# Patient Record
Sex: Female | Born: 1945 | Race: White | Hispanic: No | Smoking: Never smoker
Health system: Southern US, Community
[De-identification: ages and names within clinical notes are randomized; demographics above are authoritative.]

## PROBLEM LIST (undated history)

## (undated) DIAGNOSIS — J45909 Unspecified asthma, uncomplicated: Secondary | ICD-10-CM

## (undated) DIAGNOSIS — I10 Essential (primary) hypertension: Secondary | ICD-10-CM

---

## 2015-10-20 ENCOUNTER — Encounter (HOSPITAL_COMMUNITY): Payer: Self-pay | Admitting: *Deleted

## 2015-10-20 ENCOUNTER — Emergency Department (HOSPITAL_COMMUNITY)
Admission: EM | Admit: 2015-10-20 | Discharge: 2015-10-21 | Disposition: A | Discharge status: Home / Self Care | Payer: Medicare Other | Attending: Emergency Medicine | Admitting: Emergency Medicine

## 2015-10-20 ENCOUNTER — Emergency Department (HOSPITAL_COMMUNITY): Payer: Medicare Other

## 2015-10-20 DIAGNOSIS — S4992XA Unspecified injury of left shoulder and upper arm, initial encounter: Secondary | ICD-10-CM | POA: Diagnosis present

## 2015-10-20 DIAGNOSIS — Z79899 Other long term (current) drug therapy: Secondary | ICD-10-CM | POA: Diagnosis not present

## 2015-10-20 DIAGNOSIS — J45909 Unspecified asthma, uncomplicated: Secondary | ICD-10-CM | POA: Insufficient documentation

## 2015-10-20 DIAGNOSIS — Y998 Other external cause status: Secondary | ICD-10-CM | POA: Insufficient documentation

## 2015-10-20 DIAGNOSIS — W010XXA Fall on same level from slipping, tripping and stumbling without subsequent striking against object, initial encounter: Secondary | ICD-10-CM | POA: Diagnosis not present

## 2015-10-20 DIAGNOSIS — Y9289 Other specified places as the place of occurrence of the external cause: Secondary | ICD-10-CM | POA: Diagnosis not present

## 2015-10-20 DIAGNOSIS — S42292A Other displaced fracture of upper end of left humerus, initial encounter for closed fracture: Secondary | ICD-10-CM | POA: Insufficient documentation

## 2015-10-20 DIAGNOSIS — Y9389 Activity, other specified: Secondary | ICD-10-CM | POA: Insufficient documentation

## 2015-10-20 DIAGNOSIS — I10 Essential (primary) hypertension: Secondary | ICD-10-CM | POA: Diagnosis not present

## 2015-10-20 DIAGNOSIS — S42302A Unspecified fracture of shaft of humerus, left arm, initial encounter for closed fracture: Secondary | ICD-10-CM

## 2015-10-20 HISTORY — DX: Unspecified asthma, uncomplicated: J45.909

## 2015-10-20 HISTORY — DX: Essential (primary) hypertension: I10

## 2015-10-20 MED ORDER — HYDROCODONE-ACETAMINOPHEN 5-325 MG PO TABS: 1.0000 | ORAL_TABLET | ORAL | Status: AC | PRN

## 2015-10-20 MED ORDER — HYDROCODONE-ACETAMINOPHEN 5-325 MG PO TABS
1.0000 | ORAL_TABLET | Freq: Once | ORAL | Status: AC
  Administered 2015-10-20: 1 via ORAL
  Filled 2015-10-20: qty 1

## 2015-10-20 NOTE — Progress Notes (Signed)
Orthopedic Tech Progress Note Patient Details:  Danielle Mora 1945/11/09 [id number]  Ortho Devices Type of Ortho Device: Arm sling Ortho Device/Splint Location: lue Ortho Device/Splint Interventions: Ordered, Application   Trinna Post 10/20/2015, 11:47 PM

## 2015-10-20 NOTE — ED Notes (Signed)
Pt states she tripped and fell on her left arm. Good sensation in left extremity, strong pulse. Pt states pain starts in elbow and radiates up to shoulder. Worse with movement. Denies hitting her head, denies LOC.

## 2015-10-20 NOTE — ED Provider Notes (Signed)
CSN: [id number]     Arrival date & time 10/20/15  4098 History   First MD Initiated Contact with Patient 10/20/15 2152     Chief Complaint  Patient presents with  . Fall  . Arm Injury   Patient is a 70 y.o. female presenting with arm injury. The history is provided by the patient. No language interpreter was used.  Arm Injury Location:  Shoulder Time since incident:  5 hours Injury: yes   Mechanism of injury: amputation   Pain details:    Quality:  Aching   Radiates to:  Does not radiate   Severity:  Moderate   Onset quality:  Sudden   Duration:  5 hours   Timing:  Constant   Progression:  Unchanged Chronicity:  New Relieved by:  None tried Worsened by:  Nothing tried Ineffective treatments:  None tried Associated symptoms: decreased range of motion and swelling   Associated symptoms: no back pain, no fever, no neck pain, no numbness and no tingling     Past Medical History  Diagnosis Date  . Hypertension   . Asthma    History reviewed. No pertinent past surgical history. No family history on file. Social History  Substance Use Topics  . Smoking status: Never Smoker   . Smokeless tobacco: None  . Alcohol Use: No   OB History    No data available     Review of Systems  Constitutional: Negative for fever, chills, activity change and appetite change.  HENT: Negative for congestion, dental problem, ear pain, facial swelling, hearing loss, rhinorrhea, sneezing, sore throat, trouble swallowing and voice change.   Eyes: Negative for photophobia, pain, redness and visual disturbance.  Respiratory: Negative for apnea, cough, chest tightness, shortness of breath, wheezing and stridor.   Cardiovascular: Negative for chest pain, palpitations and leg swelling.  Gastrointestinal: Negative for nausea, vomiting, abdominal pain, diarrhea, constipation, blood in stool and abdominal distention.  Endocrine: Negative for polydipsia and polyuria.  Genitourinary: Negative for  frequency, hematuria, flank pain, decreased urine volume and difficulty urinating.  Musculoskeletal: Positive for joint swelling. Negative for back pain, gait problem, neck pain and neck stiffness.  Skin: Negative for rash and wound.  Allergic/Immunologic: Negative for immunocompromised state.  Neurological: Negative for dizziness, syncope, facial asymmetry, speech difficulty, weakness, light-headedness, numbness and headaches.  Hematological: Negative for adenopathy.  Psychiatric/Behavioral: Negative for suicidal ideas, behavioral problems, confusion, sleep disturbance and agitation. The patient is not nervous/anxious.   All other systems reviewed and are negative.   Allergies  Review of patient's allergies indicates no known allergies.  Home Medications   Prior to Admission medications   Medication Sig Start Date End Date Taking? Authorizing Provider  lisinopril (PRINIVIL,ZESTRIL) 40 MG tablet Take 40 mg by mouth daily. 10/07/15  Yes Historical Provider, MD  HYDROcodone-acetaminophen (NORCO/VICODIN) 5-325 MG tablet Take 1 tablet by mouth every 4 (four) hours as needed. 10/20/15   Dan Humphreys, MD   BP 110/71 mmHg  Pulse 78  Temp(Src) 98 F (36.7 C) (Oral)  Resp 18  SpO2 96% Physical Exam  Constitutional: She is oriented to person, place, and time. She appears well-developed and well-nourished. No distress.  HENT:  Head: Normocephalic and atraumatic.  Right Ear: External ear normal.  Left Ear: External ear normal.  Eyes: Pupils are equal, round, and reactive to light. Right eye exhibits no discharge. Left eye exhibits no discharge.  Neck: Normal range of motion. No JVD present. No tracheal deviation present.  Cardiovascular: Normal rate, regular rhythm  and normal heart sounds.  Exam reveals no friction rub.   No murmur heard. Pulmonary/Chest: Effort normal and breath sounds normal. No stridor. No respiratory distress. She has no wheezes.  Abdominal: Soft. Bowel sounds are normal.  She exhibits no distension. There is no rebound and no guarding.  Musculoskeletal: She exhibits no edema.       Left shoulder: She exhibits decreased range of motion, tenderness and swelling. She exhibits normal pulse.  Lymphadenopathy:    She has no cervical adenopathy.  Neurological: She is alert and oriented to person, place, and time. No cranial nerve deficit. Coordination normal.  Skin: Skin is warm and dry. No rash noted. No pallor.  Psychiatric: She has a normal mood and affect. Her behavior is normal. Judgment and thought content normal.  Nursing note and vitals reviewed.   ED Course  Procedures (including critical care time) Labs Review Labs Reviewed - No data to display  Imaging Review Dg Elbow Complete Left  10/20/2015  CLINICAL DATA:  Tripped over a toy and fell onto wood floor. Proximal humerus pain. EXAM: LEFT ELBOW - COMPLETE 3+ VIEW COMPARISON:  None. FINDINGS: Three-view exam of the left elbow shows no gross fracture or dislocation, but assessment is limited by non traditional positioning and lack of a true lateral view to assess for joint effusion. Bones appear demineralized. IMPRESSION: No gross bony abnormality although study limited by positioning. Consider repeat evaluation after the patient is better able to participate in positioning. Electronically Signed   By: Kennith Center M.D.   On: 10/20/2015 20:44   Dg Shoulder Left  10/20/2015  CLINICAL DATA:  Tripped over a toy and fell onto a wppd floor. EXAM: LEFT SHOULDER - 2+ VIEW COMPARISON:  None. FINDINGS: Comminuted fracture the humeral head noted. Greater tuberosity exists is a free fragment. No evidence for dislocation. No shoulder separation. IMPRESSION: Comminuted fracture of the humeral head. Electronically Signed   By: Kennith Center M.D.   On: 10/20/2015 20:46   I have personally reviewed and evaluated these images and lab results as part of my medical decision-making.   EKG Interpretation None      MDM    Final diagnoses:  Humerus fracture, left, closed, initial encounter    Patient tripped over a toy and fell on her left shoulder tonight. She has tenderness over the left shoulder. No tenderness over the clavicle. No tenderness over the elbow, forearm, wrist. She has good pulses and sensation in the distal left extremity. No LOC, no head or neck trauma. GCS 15  Vital signs stable. X-ray with comminuted fracture of humeral head.  Sling applied and patient instructed to follow-up with orthopedics in one week for reevaluation. Norco in ED and at home for pain control.  Ambulatory in no acute distress at time of discharge.  Discussed case with my attending Dr. Fayrene Fearing.    Dan Humphreys, MD 10/20/15 5784  Rolland Porter, MD 10/21/15 [phone]

## 2015-10-20 NOTE — Discharge Instructions (Signed)
Humerus Fracture Treated With Immobilization °The humerus is the large bone in your upper arm. You have a broken (fractured) humerus. These fractures are easily diagnosed with X-rays. °TREATMENT  °Simple fractures which will heal without disability are treated with simple immobilization. Immobilization means you will wear a cast, splint, or sling. You have a fracture which will do well with immobilization. The fracture will heal well simply by being held in a good position until it is stable enough to begin range of motion exercises. Do not take part in activities which would further injure your arm.  °HOME CARE INSTRUCTIONS  °· Put ice on the injured area. °¨ Put ice in a plastic bag. °¨ Place a towel between your skin and the bag. °¨ Leave the ice on for 15-20 minutes, 03-04 times a day. °· If you have a cast: °¨ Do not scratch the skin under the cast using sharp or pointed objects. °¨ Check the skin around the cast every day. You may put lotion on any red or sore areas. °¨ Keep your cast dry and clean. °· If you have a splint: °¨ Wear the splint as directed. °¨ Keep your splint dry and clean. °¨ You may loosen the elastic around the splint if your fingers become numb, tingle, or turn cold or blue. °· If you have a sling: °¨ Wear the sling as directed. °· Do not put pressure on any part of your cast or splint until it is fully hardened. °· Your cast or splint can be protected during bathing with a plastic bag. Do not lower the cast or splint into water. °· Only take over-the-counter or prescription medicines for pain, discomfort, or fever as directed by your caregiver. °· Do range of motion exercises as instructed by your caregiver. °· Follow up as directed by your caregiver. This is very important in order to avoid permanent injury or disability and chronic pain. °SEEK IMMEDIATE MEDICAL CARE IF:  °· Your skin or nails in the injured arm turn blue or gray. °· Your arm feels cold or numb. °· You develop severe pain  in the injured arm. °· You are having problems with the medicines you were given. °MAKE SURE YOU:  °· Understand these instructions. °· Will watch your condition. °· Will get help right away if you are not doing well or get worse. °  °This information is not intended to replace advice given to you by your health care provider. Make sure you discuss any questions you have with your health care provider. °  °Document Released: 11/20/2000 Document Revised: 09/04/2014 Document Reviewed: 01/06/2015 °Elsevier Interactive Patient Education ©2016 [employer] ° °

## 2015-10-21 NOTE — ED Provider Notes (Signed)
Pt seen and evaluated.  Patient with a fall and left shoulder pain. Has a three-part fracture. Impacted neck, and floating greater tuberosity. Symptoms well controlled after by mouth meds here. Neurologically neurovascularly intact. I discussed with her to expect bruising and swelling. Sleep in a position of comfort. With peak follow-up. Placed in a sling and immobilizer here. Pain medication discharged home for orthopedic follow-up.  Rolland Porter, MD 10/21/15 [phone]

## 2017-05-16 ENCOUNTER — Emergency Department: Payer: Medicare Other

## 2017-05-16 ENCOUNTER — Emergency Department
Admission: EM | Admit: 2017-05-16 | Discharge: 2017-05-16 | Disposition: A | Discharge status: Home / Self Care | Payer: Medicare Other | Attending: Emergency Medicine | Admitting: Emergency Medicine

## 2017-05-16 DIAGNOSIS — R1011 Right upper quadrant pain: Secondary | ICD-10-CM | POA: Insufficient documentation

## 2017-05-16 DIAGNOSIS — I10 Essential (primary) hypertension: Secondary | ICD-10-CM | POA: Diagnosis not present

## 2017-05-16 DIAGNOSIS — R0602 Shortness of breath: Secondary | ICD-10-CM | POA: Diagnosis present

## 2017-05-16 DIAGNOSIS — R1110 Vomiting, unspecified: Secondary | ICD-10-CM | POA: Diagnosis not present

## 2017-05-16 DIAGNOSIS — J45909 Unspecified asthma, uncomplicated: Secondary | ICD-10-CM | POA: Insufficient documentation

## 2017-05-16 DIAGNOSIS — K8020 Calculus of gallbladder without cholecystitis without obstruction: Secondary | ICD-10-CM | POA: Insufficient documentation

## 2017-05-16 DIAGNOSIS — Z79899 Other long term (current) drug therapy: Secondary | ICD-10-CM | POA: Diagnosis not present

## 2017-05-16 LAB — COMPREHENSIVE METABOLIC PANEL
ALBUMIN: 3.7 g/dL (ref 3.5–5.0)
ALT: 34 U/L (ref 14–54)
ANION GAP: 9 (ref 5–15)
AST: 51 U/L — AB (ref 15–41)
Alkaline Phosphatase: 59 U/L (ref 38–126)
BILIRUBIN TOTAL: 0.6 mg/dL (ref 0.3–1.2)
BUN: 19 mg/dL (ref 6–20)
CO2: 27 mmol/L (ref 22–32)
Calcium: 9.2 mg/dL (ref 8.9–10.3)
Chloride: 104 mmol/L (ref 101–111)
Creatinine, Ser: 0.97 mg/dL (ref 0.44–1.00)
GFR calc Af Amer: >60 mL/min (ref >60)
GFR calc non Af Amer: 58 mL/min — ABNORMAL LOW (ref >60)
GLUCOSE: 129 mg/dL — AB (ref 65–99)
Potassium: 3.3 mmol/L — ABNORMAL LOW (ref 3.5–5.1)
Sodium: 140 mmol/L (ref 135–145)
TOTAL PROTEIN: 6.8 g/dL (ref 6.5–8.1)

## 2017-05-16 LAB — CBC
HEMATOCRIT: 34.8 % — AB (ref 35.0–47.0)
HEMOGLOBIN: 12.1 g/dL (ref 12.0–16.0)
MCH: 30 pg (ref 26.0–34.0)
MCHC: 34.9 g/dL (ref 32.0–36.0)
MCV: 86 fL (ref 80.0–100.0)
Platelets: 248 10*3/uL (ref 150–440)
RBC: 4.05 MIL/uL (ref 3.80–5.20)
RDW: 13.5 % (ref 11.5–14.5)
WBC: 9.8 10*3/uL (ref 3.6–11.0)

## 2017-05-16 LAB — TROPONIN I: Troponin I: <0.03 ng/mL (ref <0.03)

## 2017-05-16 NOTE — ED Notes (Signed)
ED Provider at bedside. 

## 2017-05-16 NOTE — ED Notes (Signed)
Pt states that she had some abdominal pain that was so bad it was causing her to have problems taking a deep breath. Pt points to epigastric area. States she vomited and now she feels back to baseline. Denies pain. Denies diarrhea or fevers. Denies any difficulty breathing. Speaking in complete sentences. States "I didn't eat very well today." admits to eating donuts, peanut butter sandwich, and a salmon patty.  Alert and oriented. Speaking in complete sentences.

## 2017-05-16 NOTE — ED Triage Notes (Signed)
Pt in with co shob and tightness to upper abd and states feels like she cannot take a deep breath. Denies any hx of lung disease, no shob noted at this time.

## 2017-05-18 NOTE — ED Provider Notes (Signed)
[facility] Emergency Department Provider Note   First MD Initiated Contact with Patient 05/16/17 0302     (approximate)  I have reviewed the triage vital signs and the nursing notes.   HISTORY  Chief Complaint Shortness of Breath    HPI Danielle Mora is a 71 y.o. female with below list of chronic medical conditions presents to the emergency department with upper abdominal pain following eating accompanied by nausea. Patient denies any fever no diarrhea or constipation. She states symptoms began after she ate peanut butter on bread and believes that her symptoms may be secondary to indigestion. Patient does admit to previous episodes of the same. Patient states that she has no pain at present   Past Medical History:  Diagnosis Date  . Asthma   . Hypertension     There are no active problems to display for this patient.   No past surgical history on file.  Prior to Admission medications   Medication Sig Start Date End Date Taking? Authorizing Provider  HYDROcodone-acetaminophen (NORCO/VICODIN) 5-325 MG tablet Take 1 tablet by mouth every 4 (four) hours as needed. 10/20/15   Dan Humphreys, MD  lisinopril (PRINIVIL,ZESTRIL) 40 MG tablet Take 40 mg by mouth daily. 10/07/15   [provider]    Allergies No known drug allergies No family history on file.  Social History Social History  Substance Use Topics  . Smoking status: Never Smoker  . Smokeless tobacco: Not on file  . Alcohol use No    Review of Systems Constitutional: No fever/chills Eyes: No visual changes. ENT: No sore throat. Cardiovascular: Denies chest pain. Respiratory: Denies shortness of breath. Gastrointestinal: Positive right upper quadrant/epigastric pain.  No nausea, no vomiting.  No diarrhea.  No constipation. Genitourinary: Negative for dysuria. Musculoskeletal: Negative for neck pain.  Negative for back pain. Integumentary: Negative for rash. Neurological:  Negative for headaches, focal weakness or numbness.   ____________________________________________   PHYSICAL EXAM:  VITAL SIGNS: ED Triage Vitals  Enc Vitals Group     BP 05/16/17 0054 130/71     Pulse Rate 05/16/17 0054 (!) 58     Resp 05/16/17 0054 20     Temp 05/16/17 0054 98.1 F (36.7 C)     Temp Source 05/16/17 0054 Oral     SpO2 05/16/17 0054 98 %     Weight 05/16/17 0055 81.6 kg (180 lb)     Height 05/16/17 0055 1.651 m ( )     Head Circumference --      Peak Flow --      Pain Score 05/16/17 0055 8     Pain Loc --      Pain Edu? --      Excl. in GC? --     Constitutional: Alert and oriented. Well appearing and in no acute distress. Eyes: Conjunctivae are normal.  Head: Atraumatic. Mouth/Throat: Mucous membranes are moist.  Oropharynx non-erythematous. Neck: No stridor.   Cardiovascular: Normal rate, regular rhythm. Good peripheral circulation. Grossly normal heart sounds. Respiratory: Normal respiratory effort.  No retractions. Lungs CTAB. Gastrointestinal: Right upper quadrant tenderness with palpation No distention.  Musculoskeletal: No lower extremity tenderness nor edema. No gross deformities of extremities. Neurologic:  Normal speech and language. No gross focal neurologic deficits are appreciated.  Skin:  Skin is warm, dry and intact. No rash noted. Psychiatric: Mood and affect are normal. Speech and behavior are normal.  ____________________________________________   LABS (all labs ordered are listed, but only abnormal results are  displayed)  Labs Reviewed  CBC - Abnormal; Notable for the following:       Result Value   HCT 34.8 (*)    All other components within normal limits  COMPREHENSIVE METABOLIC PANEL - Abnormal; Notable for the following:    Potassium 3.3 (*)    Glucose, Bld 129 (*)    AST 51 (*)    GFR calc non Af Amer 58 (*)    All other components within normal limits  TROPONIN I    ____________________________________________  EKG  ED ECG REPORT I, Patrick N Smrithi Pigford, the attending physician, personally viewed and interpreted this ECG.   Date: 05/18/2017  EKG Time: 12:57 AM  Rate: 54  Rhythm: Sinus bradycardia  Axis: Normal  Intervals: Normal  ST&T Change: None  ____________________________________________  RADIOLOGY I, [place] Kanae Ignatowski, personally viewed and evaluated these images (plain radiographs) as part of my medical decision making, as well as reviewing the written report by the radiologist.  CLINICAL DATA:  Right upper quadrant pain and vomiting since 8 p.m.  EXAM: ULTRASOUND ABDOMEN LIMITED RIGHT UPPER QUADRANT  COMPARISON:  None.  FINDINGS: Gallbladder:  Cholelithiasis with several stones in the gallbladder, largest measuring 2.5 cm diameter. Borderline gallbladder wall thickening with mild pericholecystic edema is present. Murphy's sign is negative.  Common bile duct:  Diameter: 8.5 mm, mildly dilated. No intraductal stones are identified but the distal common bile duct is obscured by overlying bowel gas.  Liver:  No focal lesion identified. Within normal limits in parenchymal echogenicity. Portal vein is patent on color Doppler imaging with normal direction of blood flow towards the liver.  IMPRESSION: Cholelithiasis with pericholecystic edema may indicate acute cholecystitis. Mild bile duct dilatation, cause undetermined.   Electronically Signed   By: Burman Nieves M.D.   On: 05/16/2017 04:05  ____________________________________________   :   Procedures   ____________________________________________   INITIAL IMPRESSION / ASSESSMENT AND PLAN / ED COURSE  Pertinent labs & imaging results that were available during my care of the patient were reviewed by me and considered in my medical decision making (see chart for details).  71 year old female presenting with upper abdominal discomfort as  stated above physical exam with right upper quadrant pain and a such concern for Differential diagnosis Cholelithiasis Cholecystitis Gastric ulcer Indigestion  Ultrasound findings consistent with cholelithiasis concern for possible cholecystitis given ultrasound findings. Patient discussed with Dr.piscoya general surgeon on call who advised for the patient to be discharged home given the fact the patient is pain-free LFTs are normal and afebrile.    ____________________________________________  FINAL CLINICAL IMPRESSION(S) / ED DIAGNOSES  Final diagnoses:  Vomiting  RUQ pain  Gallstones     MEDICATIONS GIVEN DURING THIS VISIT:  Medications - No data to display   NEW OUTPATIENT MEDICATIONS STARTED DURING THIS VISIT:  Discharge Medication List as of 05/16/2017  4:47 AM      Discharge Medication List as of 05/16/2017  4:47 AM      Discharge Medication List as of 05/16/2017  4:47 AM       Note:  This document was prepared using Dragon voice recognition software and may include unintentional dictation errors.    Darci Current, MD 05/18/17 [phone]

## 2017-05-22 ENCOUNTER — Telehealth: Payer: Self-pay | Admitting: General Practice

## 2017-05-22 NOTE — Telephone Encounter (Signed)
Left a message for the patient to call the office, patient needs an ED Follow-up (05/16/17): Cholelithiasis with Biliary Duct Dilation, needs to be seen as soon as possible and placed on schedule for next available. Please schedule if patient calls back.

## 2017-05-25 ENCOUNTER — Encounter: Payer: Self-pay | Admitting: General Practice

## 2017-05-25 NOTE — Telephone Encounter (Signed)
Spoke with the patient said she was doing fine, she said she is going to see her primary care doctor and see what they have to tell her and if she needs to still see one of out surgeons.

## 2017-07-10 ENCOUNTER — Other Ambulatory Visit: Payer: Self-pay | Admitting: Internal Medicine

## 2017-07-10 DIAGNOSIS — Z1231 Encounter for screening mammogram for malignant neoplasm of breast: Secondary | ICD-10-CM

## 2017-10-29 ENCOUNTER — Ambulatory Visit
Admission: RE | Admit: 2017-10-29 | Discharge: 2017-10-29 | Disposition: A | Discharge status: Home / Self Care | Payer: Medicare Other | Source: Ambulatory Visit | Attending: Internal Medicine | Admitting: Internal Medicine

## 2017-10-29 ENCOUNTER — Other Ambulatory Visit: Payer: Self-pay | Admitting: Internal Medicine

## 2017-10-29 DIAGNOSIS — Z1231 Encounter for screening mammogram for malignant neoplasm of breast: Secondary | ICD-10-CM | POA: Diagnosis not present

## 2017-11-27 ENCOUNTER — Other Ambulatory Visit: Payer: Self-pay | Admitting: Physician Assistant

## 2017-11-27 DIAGNOSIS — S42434A Nondisplaced fracture (avulsion) of lateral epicondyle of right humerus, initial encounter for closed fracture: Secondary | ICD-10-CM

## 2017-11-27 DIAGNOSIS — M24311 Pathological dislocation of right shoulder, not elsewhere classified: Secondary | ICD-10-CM

## 2017-12-06 ENCOUNTER — Ambulatory Visit
Admission: RE | Admit: 2017-12-06 | Discharge: 2017-12-06 | Disposition: A | Discharge status: Home / Self Care | Payer: Medicare Other | Source: Ambulatory Visit | Attending: Physician Assistant | Admitting: Physician Assistant

## 2017-12-06 DIAGNOSIS — M24311 Pathological dislocation of right shoulder, not elsewhere classified: Secondary | ICD-10-CM

## 2017-12-06 DIAGNOSIS — S42434A Nondisplaced fracture (avulsion) of lateral epicondyle of right humerus, initial encounter for closed fracture: Secondary | ICD-10-CM

## 2017-12-12 ENCOUNTER — Ambulatory Visit
Admission: RE | Admit: 2017-12-12 | Discharge: 2017-12-12 | Disposition: A | Discharge status: Home / Self Care | Payer: Medicare Other | Source: Ambulatory Visit | Attending: Physician Assistant | Admitting: Physician Assistant

## 2017-12-12 DIAGNOSIS — M19011 Primary osteoarthritis, right shoulder: Secondary | ICD-10-CM | POA: Insufficient documentation

## 2017-12-12 DIAGNOSIS — S43431A Superior glenoid labrum lesion of right shoulder, initial encounter: Secondary | ICD-10-CM | POA: Diagnosis not present

## 2017-12-12 DIAGNOSIS — M24311 Pathological dislocation of right shoulder, not elsewhere classified: Secondary | ICD-10-CM | POA: Diagnosis present

## 2017-12-12 DIAGNOSIS — X58XXXA Exposure to other specified factors, initial encounter: Secondary | ICD-10-CM | POA: Insufficient documentation

## 2017-12-12 DIAGNOSIS — S42434A Nondisplaced fracture (avulsion) of lateral epicondyle of right humerus, initial encounter for closed fracture: Secondary | ICD-10-CM | POA: Diagnosis present

## 2017-12-12 DIAGNOSIS — M7551 Bursitis of right shoulder: Secondary | ICD-10-CM | POA: Insufficient documentation

## 2017-12-12 DIAGNOSIS — S43081A Other subluxation of right shoulder joint, initial encounter: Secondary | ICD-10-CM | POA: Insufficient documentation

## 2018-12-04 ENCOUNTER — Other Ambulatory Visit: Payer: Self-pay | Admitting: Internal Medicine

## 2019-06-23 ENCOUNTER — Other Ambulatory Visit: Payer: Self-pay | Admitting: Internal Medicine

## 2019-06-23 DIAGNOSIS — Z1231 Encounter for screening mammogram for malignant neoplasm of breast: Secondary | ICD-10-CM

## 2021-04-28 DEATH — deceased

## 2021-06-02 ENCOUNTER — Other Ambulatory Visit: Payer: Self-pay | Admitting: Internal Medicine

## 2021-06-02 ENCOUNTER — Other Ambulatory Visit (HOSPITAL_BASED_OUTPATIENT_CLINIC_OR_DEPARTMENT_OTHER): Payer: Self-pay | Admitting: Internal Medicine

## 2021-06-02 DIAGNOSIS — Z1231 Encounter for screening mammogram for malignant neoplasm of breast: Secondary | ICD-10-CM

## 2021-06-02 DIAGNOSIS — I10 Essential (primary) hypertension: Secondary | ICD-10-CM

## 2021-06-02 DIAGNOSIS — K8020 Calculus of gallbladder without cholecystitis without obstruction: Secondary | ICD-10-CM

## 2021-06-15 ENCOUNTER — Ambulatory Visit: Payer: Medicare Other

## 2021-11-09 ENCOUNTER — Other Ambulatory Visit: Payer: Self-pay

## 2021-11-09 ENCOUNTER — Encounter: Payer: Self-pay | Admitting: Emergency Medicine

## 2021-11-09 ENCOUNTER — Emergency Department: Payer: Medicare Other

## 2021-11-09 DIAGNOSIS — I10 Essential (primary) hypertension: Secondary | ICD-10-CM | POA: Insufficient documentation

## 2021-11-09 DIAGNOSIS — R062 Wheezing: Secondary | ICD-10-CM | POA: Diagnosis present

## 2021-11-09 DIAGNOSIS — Z20822 Contact with and (suspected) exposure to covid-19: Secondary | ICD-10-CM | POA: Diagnosis not present

## 2021-11-09 DIAGNOSIS — J4521 Mild intermittent asthma with (acute) exacerbation: Secondary | ICD-10-CM | POA: Diagnosis not present

## 2021-11-09 LAB — CBC WITH DIFFERENTIAL/PLATELET
Abs Immature Granulocytes: 0.02 10*3/uL (ref 0.00–0.07)
Basophils Absolute: 0.1 10*3/uL (ref 0.0–0.1)
Basophils Relative: 1 %
Eosinophils Absolute: 0.7 10*3/uL — ABNORMAL HIGH (ref 0.0–0.5)
Eosinophils Relative: 8 %
HCT: 41 % (ref 36.0–46.0)
Hemoglobin: 12.8 g/dL (ref 12.0–15.0)
Immature Granulocytes: 0 %
Lymphocytes Relative: 31 %
Lymphs Abs: 2.6 10*3/uL (ref 0.7–4.0)
MCH: 28.1 pg (ref 26.0–34.0)
MCHC: 31.2 g/dL (ref 30.0–36.0)
MCV: 90.1 fL (ref 80.0–100.0)
Monocytes Absolute: 0.9 10*3/uL (ref 0.1–1.0)
Monocytes Relative: 10 %
Neutro Abs: 4.3 10*3/uL (ref 1.7–7.7)
Neutrophils Relative %: 50 %
Platelets: 215 10*3/uL (ref 150–400)
RBC: 4.55 MIL/uL (ref 3.87–5.11)
RDW: 13.3 % (ref 11.5–15.5)
WBC: 8.6 10*3/uL (ref 4.0–10.5)
nRBC: 0 % (ref 0.0–0.2)

## 2021-11-09 NOTE — ED Triage Notes (Signed)
Patient ambulatory to triage with steady gait, without difficulty or distress noted; pt reports SHOB, hx asthma and out of inhaler; nonprod cough ?

## 2021-11-10 ENCOUNTER — Emergency Department
Admission: EM | Admit: 2021-11-10 | Discharge: 2021-11-10 | Disposition: A | Discharge status: Home / Self Care | Payer: Medicare Other | Attending: Emergency Medicine | Admitting: Emergency Medicine

## 2021-11-10 LAB — BASIC METABOLIC PANEL
Anion gap: 5 (ref 5–15)
BUN: 21 mg/dL (ref 8–23)
CO2: 29 mmol/L (ref 22–32)
Calcium: 8.5 mg/dL — ABNORMAL LOW (ref 8.9–10.3)
Chloride: 105 mmol/L (ref 98–111)
Creatinine, Ser: 0.78 mg/dL (ref 0.44–1.00)
GFR, Estimated: >60 mL/min (ref >60)
Glucose, Bld: 92 mg/dL (ref 70–99)
Potassium: 4.5 mmol/L (ref 3.5–5.1)
Sodium: 139 mmol/L (ref 135–145)

## 2021-11-10 LAB — RESP PANEL BY RT-PCR (FLU A&B, COVID) ARPGX2
Influenza A by PCR: NEGATIVE
Influenza B by PCR: NEGATIVE
SARS Coronavirus 2 by RT PCR: NEGATIVE

## 2021-11-10 LAB — TROPONIN I (HIGH SENSITIVITY): Troponin I (High Sensitivity): 5 ng/L (ref <18)

## 2021-11-10 MED ORDER — PREDNISONE 20 MG PO TABS
60.0000 mg | ORAL_TABLET | Freq: Once | ORAL | Status: AC
  Administered 2021-11-10: 60 mg via ORAL
  Filled 2021-11-10: qty 3

## 2021-11-10 MED ORDER — ALBUTEROL SULFATE HFA 108 (90 BASE) MCG/ACT IN AERS
2.0000 | INHALATION_SPRAY | Freq: Four times a day (QID) | RESPIRATORY_TRACT | 2 refills | Status: AC | PRN
Start: 2021-11-10 — End: ?

## 2021-11-10 MED ORDER — IPRATROPIUM-ALBUTEROL 0.5-2.5 (3) MG/3ML IN SOLN
3.0000 mL | Freq: Once | RESPIRATORY_TRACT | Status: AC
Start: 2021-11-10 — End: 2021-11-10
  Administered 2021-11-10: 3 mL via RESPIRATORY_TRACT
  Filled 2021-11-10: qty 3

## 2021-11-10 MED ORDER — PREDNISONE 50 MG PO TABS: 50.0000 mg | ORAL_TABLET | Freq: Every day | ORAL | 0 refills | Status: AC

## 2021-11-10 NOTE — ED Provider Notes (Signed)
? ?[facility] ?Provider Note ? ? ? Event Date/Time  ? First MD Initiated Contact with Patient 11/10/21 0046   ?  (approximate) ? ? ?History  ? ?No chief complaint on file. ? ? ?HPI ? ?Danielle Mora is a 76 y.o. female who presents to the ED for evaluation of No chief complaint on file. ?  ?I reviewed PCP visit from October.  History of obesity, HTN, intermittent asthma. ? ?Patient presents to the ED, accompanied by her son, for evaluation of wheezing, shortness of breath and anxiety in the setting of running out of her albuterol inhaler this evening. ? ?She reports that she has been using her albuterol MDI more recently in the past week or 2, and she ran out of her prescription and does not have any refills available.  She reports developing anxiety this evening, grabbing her son and presenting to the ED for evaluation of wheezing on exertion. ? ?Denies chest pain with exertion or at rest, or shortness of breath at rest.  Denies fevers or increased sputum production.  No recent steroids.  Never been prescribed a controller inhaler. ? ?Physical Exam  ? ?Triage Vital Signs: ?ED Triage Vitals  ?Enc Vitals Group  ?   BP 11/09/21 2321 (!) 138/97  ?   Pulse Rate 11/09/21 2321 78  ?   Resp 11/09/21 2321 18  ?   Temp 11/09/21 2321 98.1 ?F (36.7 ?C)  ?   Temp Source 11/09/21 2321 Oral  ?   SpO2 11/09/21 2321 95 %  ?   Weight 11/09/21 2320 180 lb (81.6 kg)  ?   Height 11/09/21 2320 5\' 3"  (1.6 m)  ?   Head Circumference --   ?   Peak Flow --   ?   Pain Score 11/09/21 2320 0  ?   Pain Loc --   ?   Pain Edu? --   ?   Excl. in GC? --   ? ? ?Most recent vital signs: ?Vitals:  ? 11/09/21 2321 11/10/21 0100  ?BP: (!) 138/97 (!) 125/58  ?Pulse: 78 66  ?Resp: 18 (!) 29  ?Temp: 98.1 ?F (36.7 ?C)   ?SpO2: 95% 99%  ? ? ?General: Awake, no distress.  Pleasant and conversational in full sentences. ?CV:  Good peripheral perfusion. RRR ?Resp:  Normal effort.  Diffuse wheezing without focal features. ?Abd:  No distention.   ?MSK:  No deformity noted.  ?Neuro:  No focal deficits appreciated. ?Other:   ? ? ?ED Results / Procedures / Treatments  ? ?Labs ?(all labs ordered are listed, but only abnormal results are displayed) ?Labs Reviewed  ?CBC WITH DIFFERENTIAL/PLATELET - Abnormal; Notable for the following components:  ?    Result Value  ? Eosinophils Absolute 0.7 (*)   ? All other components within normal limits  ?BASIC METABOLIC PANEL - Abnormal; Notable for the following components:  ? Calcium 8.5 (*)   ? All other components within normal limits  ?RESP PANEL BY RT-PCR (FLU A&B, COVID) [id number]  ?TROPONIN I (HIGH SENSITIVITY)  ?TROPONIN I (HIGH SENSITIVITY)  ? ? ?EKG ?Sinus rhythm, rate of 80 bpm.  Normal axis and intervals.  No evidence of acute ischemia. ? 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  No evidence of infiltrate or PTX ? ?Official radiology report(s): ?DG Chest 2 View ? ?Result Date: 11/09/2021 ?CLINICAL DATA:  Cough, short of breath EXAM: CHEST - 2 VIEW COMPARISON:  05/16/2017  FINDINGS: Frontal and lateral views of the chest are obtained. Cardiac silhouette is unremarkable. Mild ectasia of the thoracic aorta. Large rounded density within the anterior right lower chest may reflect marked eventration of the right hemidiaphragm. No acute airspace disease, effusion, or pneumothorax. There are no acute bony abnormalities. IMPRESSION: 1. No acute intrathoracic process. 2. 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 This has progressed since prior study. Electronically Signed   By: Sharlet Salina M.D.   On: 11/09/2021 23:45   ? ?PROCEDURES and INTERVENTIONS: ? ?.1-3 Lead EKG Interpretation ?Performed by: Delton Prairie, MD ?Authorized by: Delton Prairie, MD  ? ?  Interpretation: normal   ?  ECG rate:  68 ?  ECG rate assessment: normal   ?  Rhythm: sinus rhythm   ?  Ectopy: none   ?  Conduction:  normal   ? ?Medications  ?ipratropium-albuterol (DUONEB) 0.5-2.5 (3) MG/3ML nebulizer solution 3 mL (3 mLs Nebulization Given 11/10/21 0133)  ?predniSONE (DELTASONE) tablet 60 mg (60 mg Oral Given 11/10/21 0133)  ? ? ? ?IMPRESSION / MDM / ASSESSMENT AND PLAN / ED COURSE  ?I reviewed the triage vital signs and the nursing notes. ? ?76 year old female presents to the ED with an asthma exacerbation, ultimately suitable for outpatient management.  No hypoxia or instability.  She has evidence of a mild exacerbation on exam with wheezing, without distress.  Indications for BiPAP.  Blood work is benign with normal CBC and metabolic panel.  Negative troponin and nonischemic EKG.  COVID/flu is negative.  CXR without infiltrate or PTX.  I considered medical observation admission for this patient, but she improved with DuoNeb and I think ultimately outpatient management is reasonable.  We discussed return precautions.  Discharged with refill of her albuterol and prednisone burst. ? ?  ? ? ?FINAL CLINICAL IMPRESSION(S) / ED DIAGNOSES  ? ?Final diagnoses:  ?Mild intermittent asthma with exacerbation  ?Wheezing  ? ? ? ?Rx / DC Orders  ? ?ED Discharge Orders   ? ?      Ordered  ?  predniSONE (DELTASONE) 50 MG tablet  Daily       ? 11/10/21 0124  ?  albuterol (VENTOLIN HFA) 108 (90 Base) MCG/ACT inhaler  Every 6 hours PRN       ? 11/10/21 0124  ? ?  ?  ? ?  ? ? ? ?Note:  This document was prepared using Dragon voice recognition software and may include unintentional dictation errors. ?  ?Delton Prairie, MD ?11/10/21 [phone] ? ?

## 2021-11-10 NOTE — Discharge Instructions (Signed)
You are being discharged with 2 prescriptions.  A refill of your albuterol inhaler as well as prednisone steroids.   ? ?We gave you a dose of steroids for today (Thursday) already, please pick up this medication and start taking on Friday.  1 tablet once daily. ?

## 2021-12-14 ENCOUNTER — Other Ambulatory Visit: Payer: Self-pay | Admitting: Internal Medicine

## 2021-12-14 DIAGNOSIS — Z1231 Encounter for screening mammogram for malignant neoplasm of breast: Secondary | ICD-10-CM

## 2022-10-10 ENCOUNTER — Other Ambulatory Visit: Payer: Self-pay | Admitting: Internal Medicine

## 2022-10-10 DIAGNOSIS — Z1231 Encounter for screening mammogram for malignant neoplasm of breast: Secondary | ICD-10-CM

## 2023-04-09 ENCOUNTER — Ambulatory Visit: Payer: Medicare Other | Admitting: Family Medicine

## 2023-04-11 ENCOUNTER — Other Ambulatory Visit: Payer: Self-pay | Admitting: Internal Medicine

## 2023-04-11 DIAGNOSIS — Z1231 Encounter for screening mammogram for malignant neoplasm of breast: Secondary | ICD-10-CM

## 2023-05-30 IMAGING — CR DG CHEST 2V
2 series · 2 of 2 positions shown · non-contrast
Comparison: 05/16/2017

CLINICAL DATA: Cough, short of breath

EXAM:
CHEST - 2 VIEW

[chest pa]
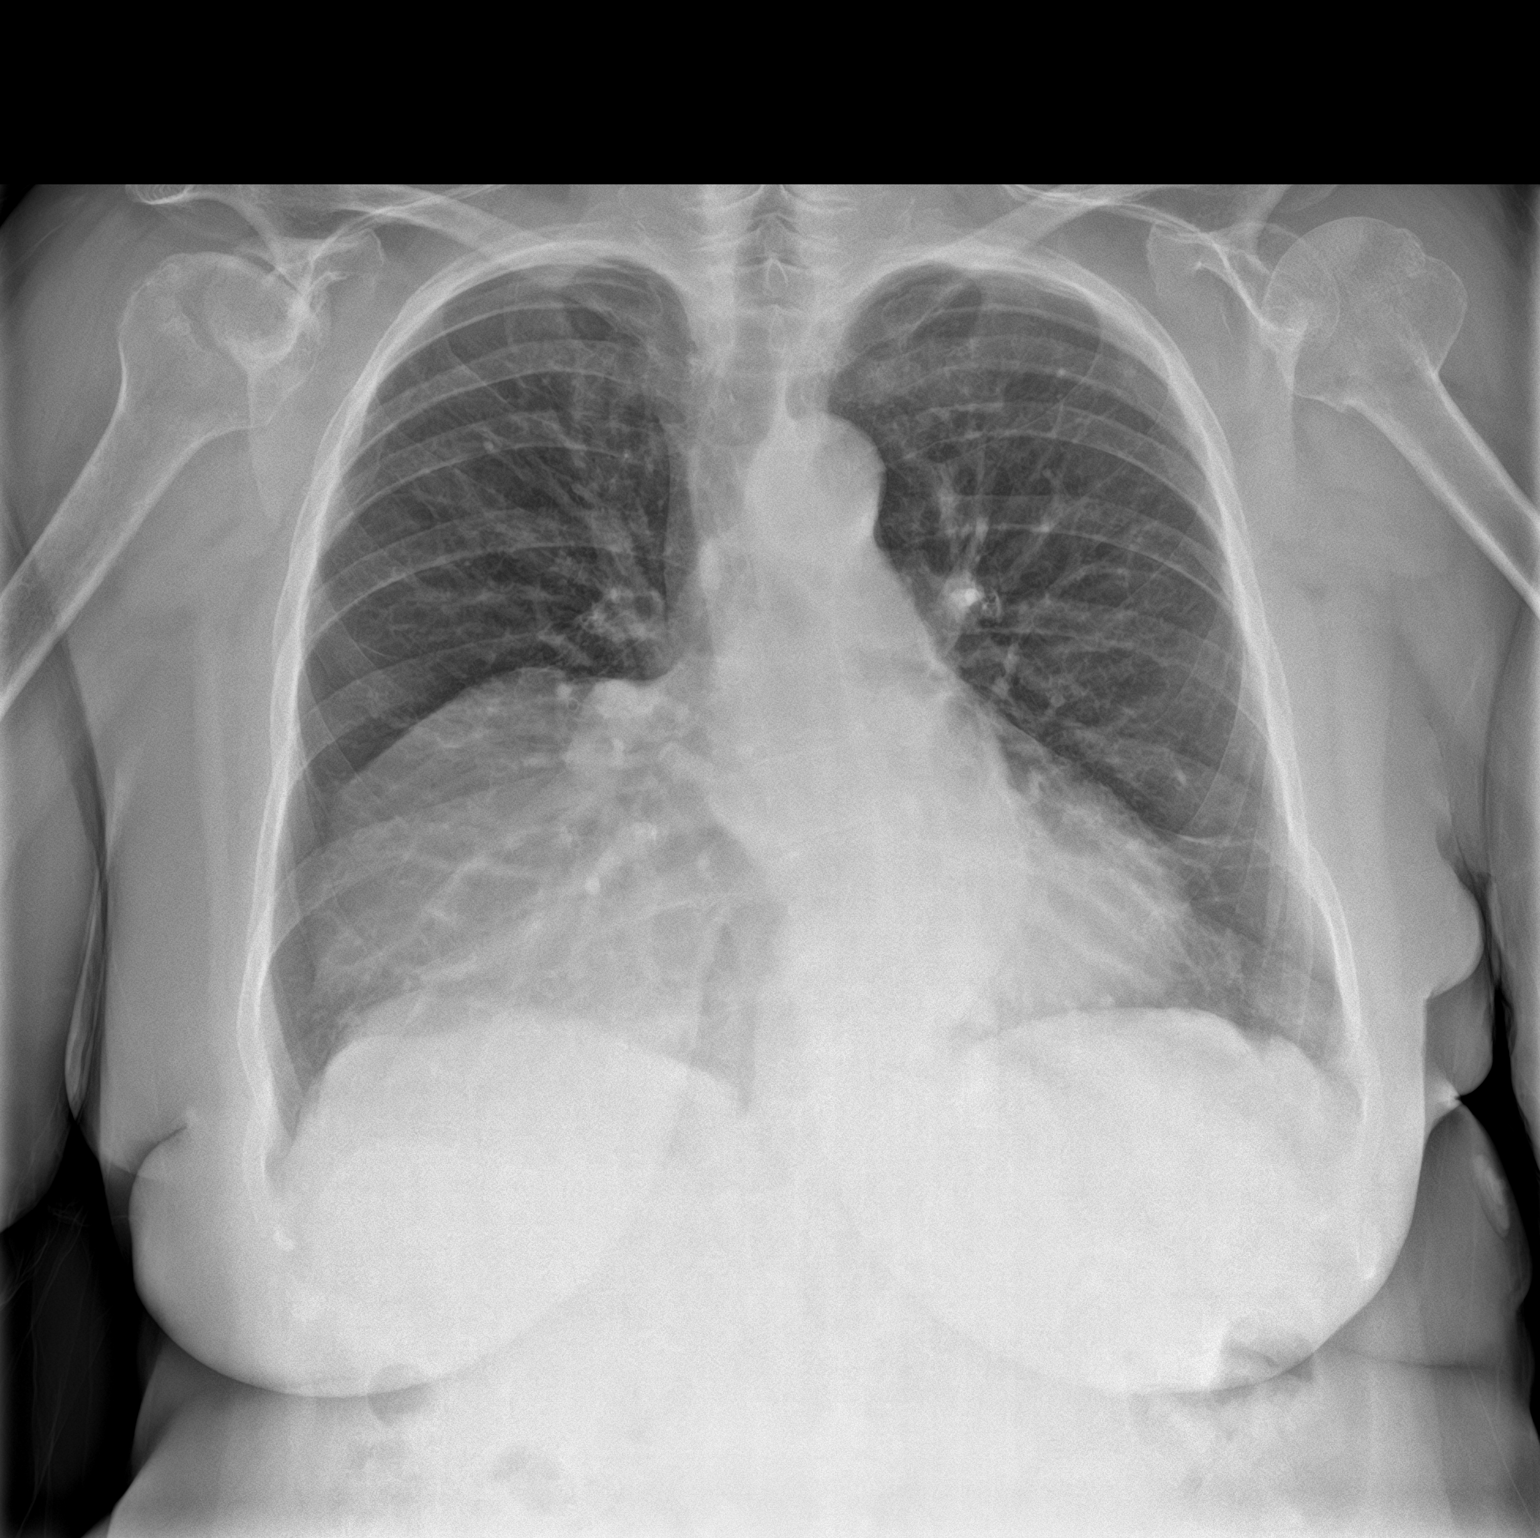

[chest lat]
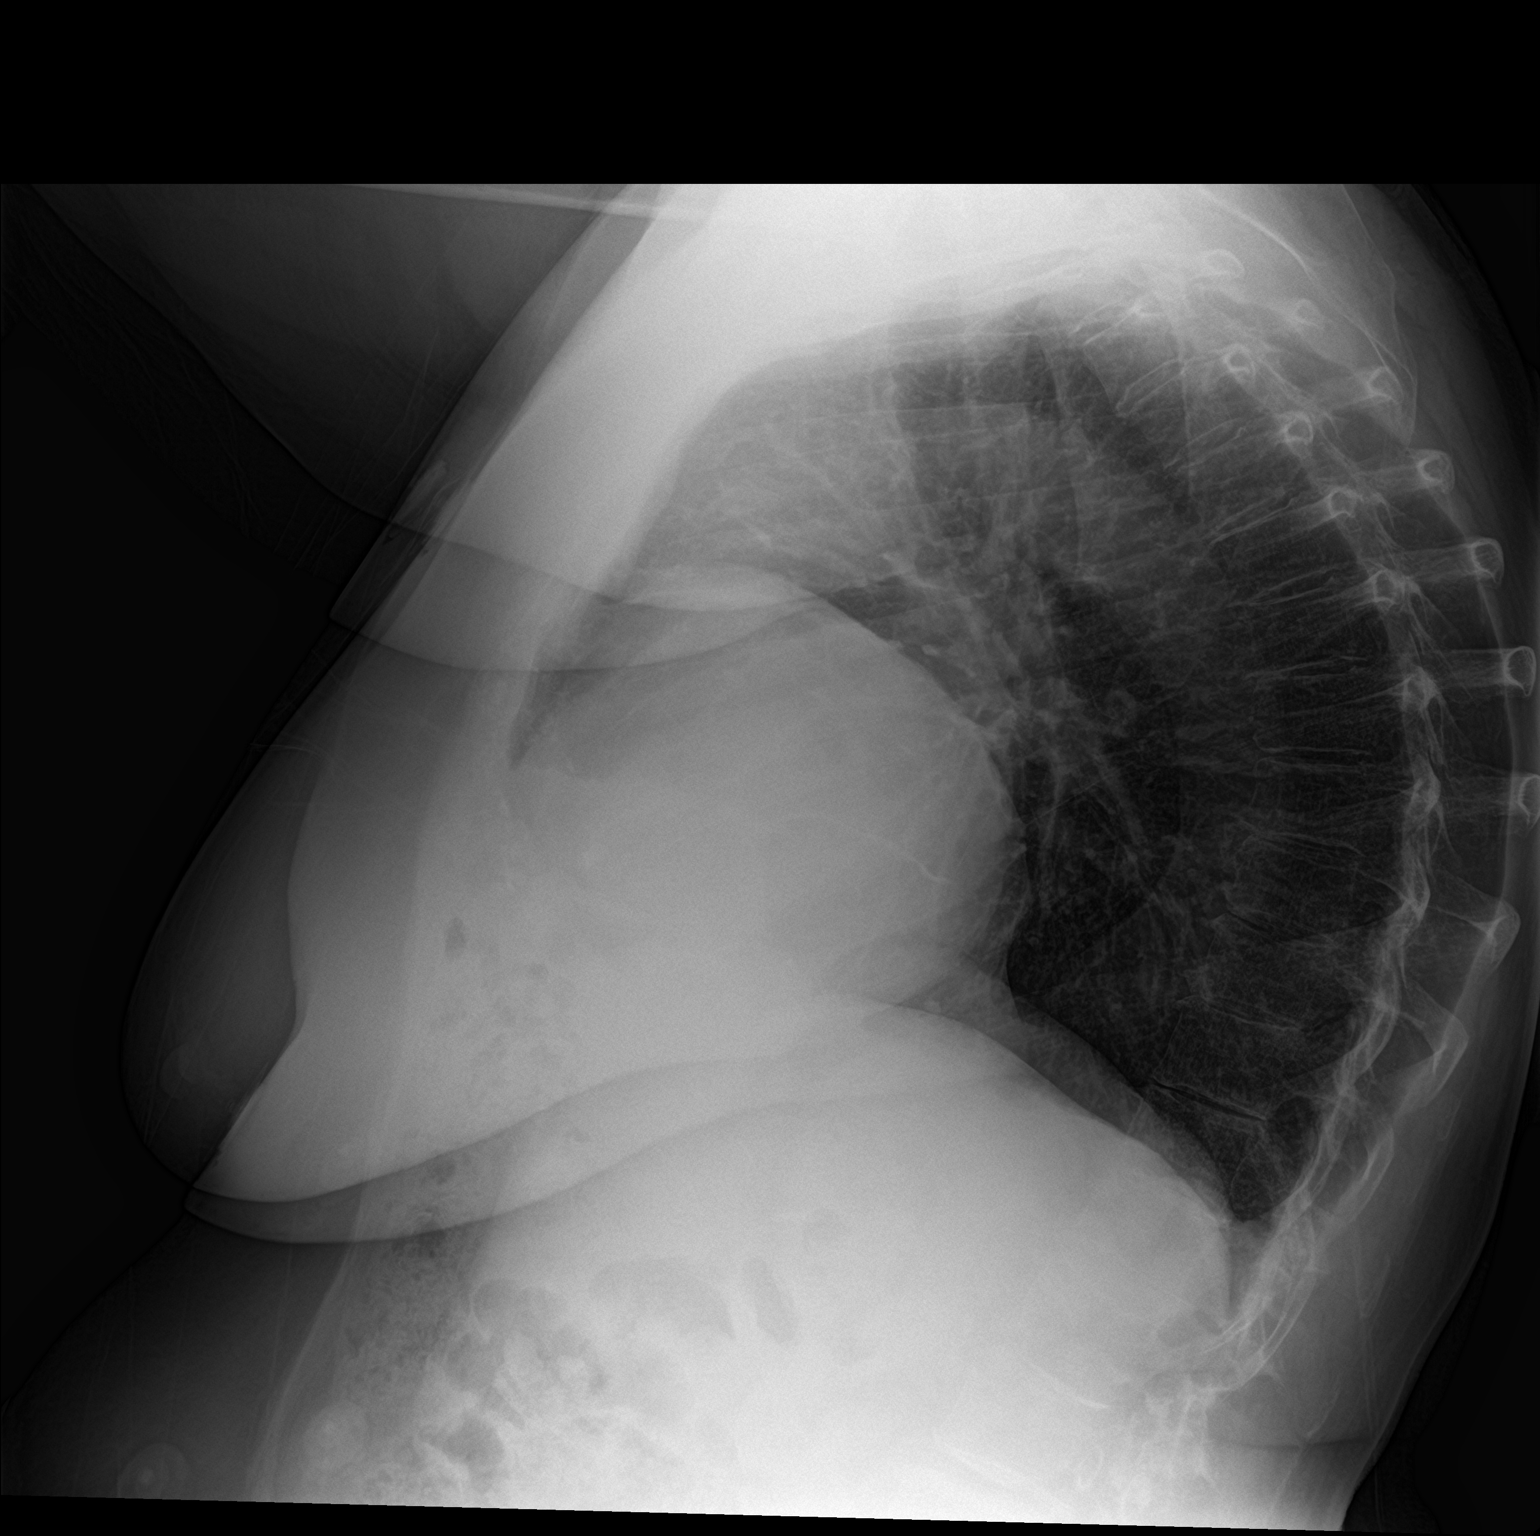

[2 of 2 positions shown; findings below may reference images not displayed]

FINDINGS: Frontal and lateral views of the chest are obtained. Cardiac
silhouette is unremarkable. Mild ectasia of the thoracic aorta.
Large rounded density within the anterior right lower chest may
reflect marked eventration of the right hemidiaphragm. No acute
airspace disease, effusion, or pneumothorax. There are no acute bony
abnormalities.
IMPRESSION: 1. No acute intrathoracic process.
2. Large rounded density within the anterior right lower chest,
likely marked eventration of the right hemidiaphragm. This has
progressed since prior study.

## 2023-06-15 ENCOUNTER — Ambulatory Visit: Payer: Medicare Other | Admitting: Family Medicine
